# Patient Record
Sex: Female | Born: 1950 | Race: Black or African American | Hispanic: No | Marital: Married
Health system: Southern US, Community
[De-identification: ages and names within clinical notes are randomized; demographics above are authoritative.]

---

## 2019-05-31 ENCOUNTER — Emergency Department (HOSPITAL_COMMUNITY): Payer: No Typology Code available for payment source

## 2019-05-31 ENCOUNTER — Emergency Department (HOSPITAL_COMMUNITY)
Admission: EM | Admit: 2019-05-31 | Discharge: 2019-05-31 | Disposition: A | Discharge status: Home / Self Care | Payer: No Typology Code available for payment source | Attending: Emergency Medicine | Admitting: Emergency Medicine

## 2019-05-31 ENCOUNTER — Other Ambulatory Visit: Payer: Self-pay

## 2019-05-31 DIAGNOSIS — M542 Cervicalgia: Secondary | ICD-10-CM | POA: Insufficient documentation

## 2019-05-31 DIAGNOSIS — Y92410 Unspecified street and highway as the place of occurrence of the external cause: Secondary | ICD-10-CM | POA: Diagnosis not present

## 2019-05-31 DIAGNOSIS — Y999 Unspecified external cause status: Secondary | ICD-10-CM | POA: Diagnosis not present

## 2019-05-31 DIAGNOSIS — M545 Low back pain, unspecified: Secondary | ICD-10-CM

## 2019-05-31 DIAGNOSIS — Y9389 Activity, other specified: Secondary | ICD-10-CM | POA: Diagnosis not present

## 2019-05-31 MED ORDER — MECLIZINE HCL 25 MG PO TABS
25.0000 mg | ORAL_TABLET | Freq: Once | ORAL | Status: AC
  Administered 2019-05-31: 25 mg via ORAL
  Filled 2019-05-31: qty 1

## 2019-05-31 NOTE — Discharge Instructions (Addendum)
Extra strength tylenol as needed for pain. Over-the-counter lidocaine patches can be helpful as well.  In addition to the medications I have provided use heat and/or cold therapy can be used to treat your muscle aches. 15 minutes on and 15 minutes off.  Follow up with your primary care doctor if you are not starting to feel better by Monday.   Return to ER for new or worsening symptoms, any additional concerns.

## 2019-05-31 NOTE — ED Notes (Signed)
Pt endorses improvement of dizziness after the meclizine
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
## 2019-05-31 NOTE — ED Notes (Signed)
Patient verbalizes understanding of discharge instructions. Opportunity for questioning and answers were provided. Armband removed by staff, pt discharged from ED.  
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
